# Patient Record
Sex: Male | Born: 2000 | Hispanic: No | Marital: Single | Smoking: Never smoker
Health system: Southern US, Community
[De-identification: ages and names within clinical notes are randomized; demographics above are authoritative.]

---
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
## 2022-03-16 IMAGING — DX DG FOOT COMPLETE 3+V*L*
3 series · 3 of 3 positions shown · non-contrast
Comparison: None available.

CLINICAL DATA: Reevaluate fifth metatarsal base fracture

EXAM:
LEFT FOOT - COMPLETE 3+ VIEW

[foot ap]
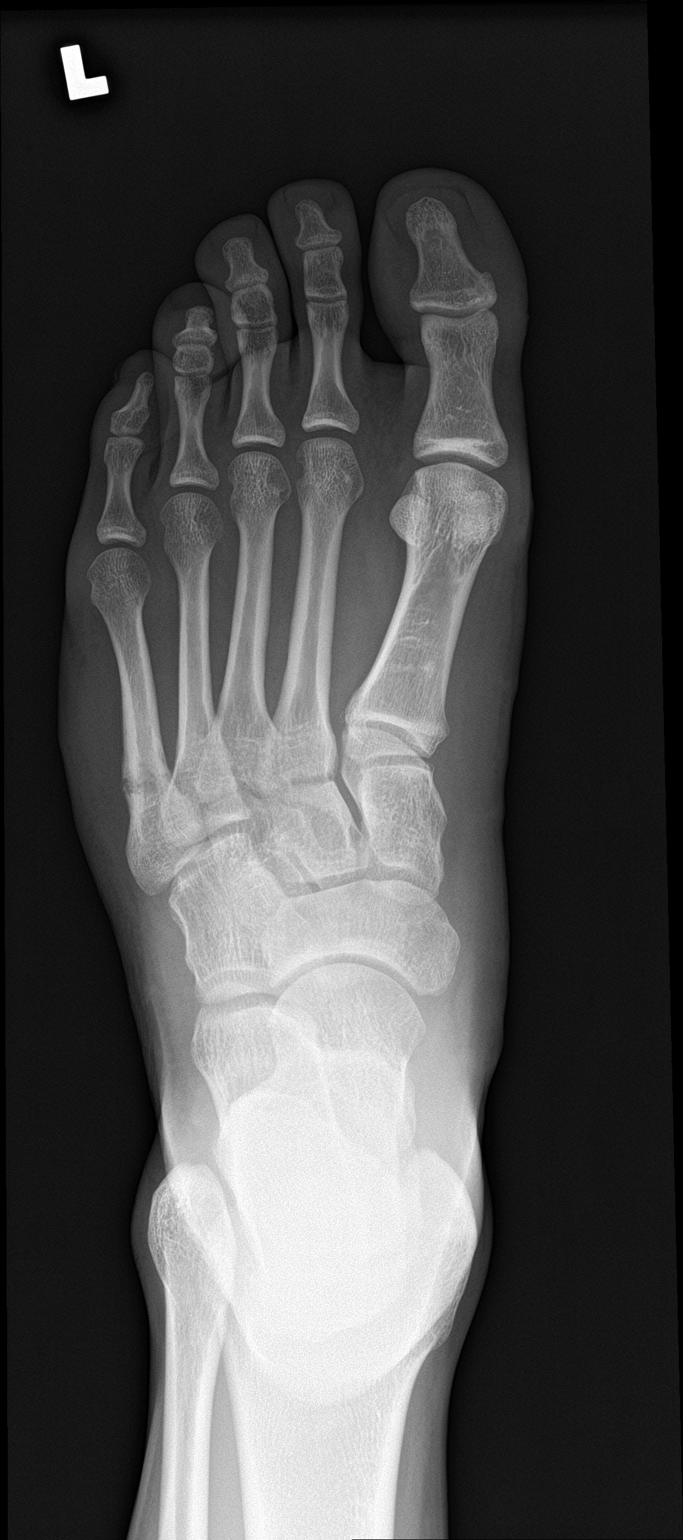

[foot obl]
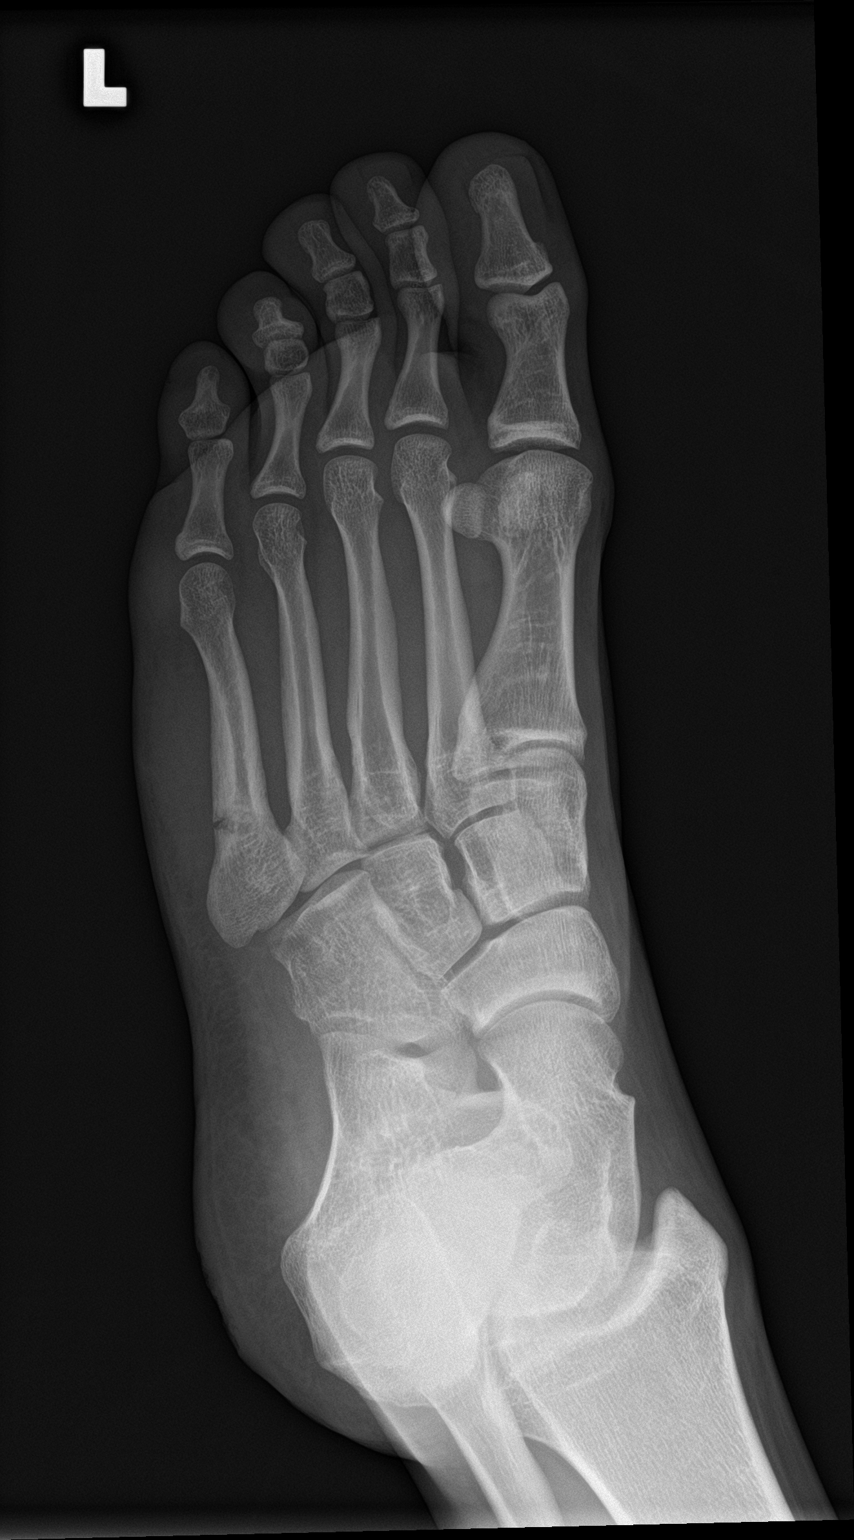

[foot lat]
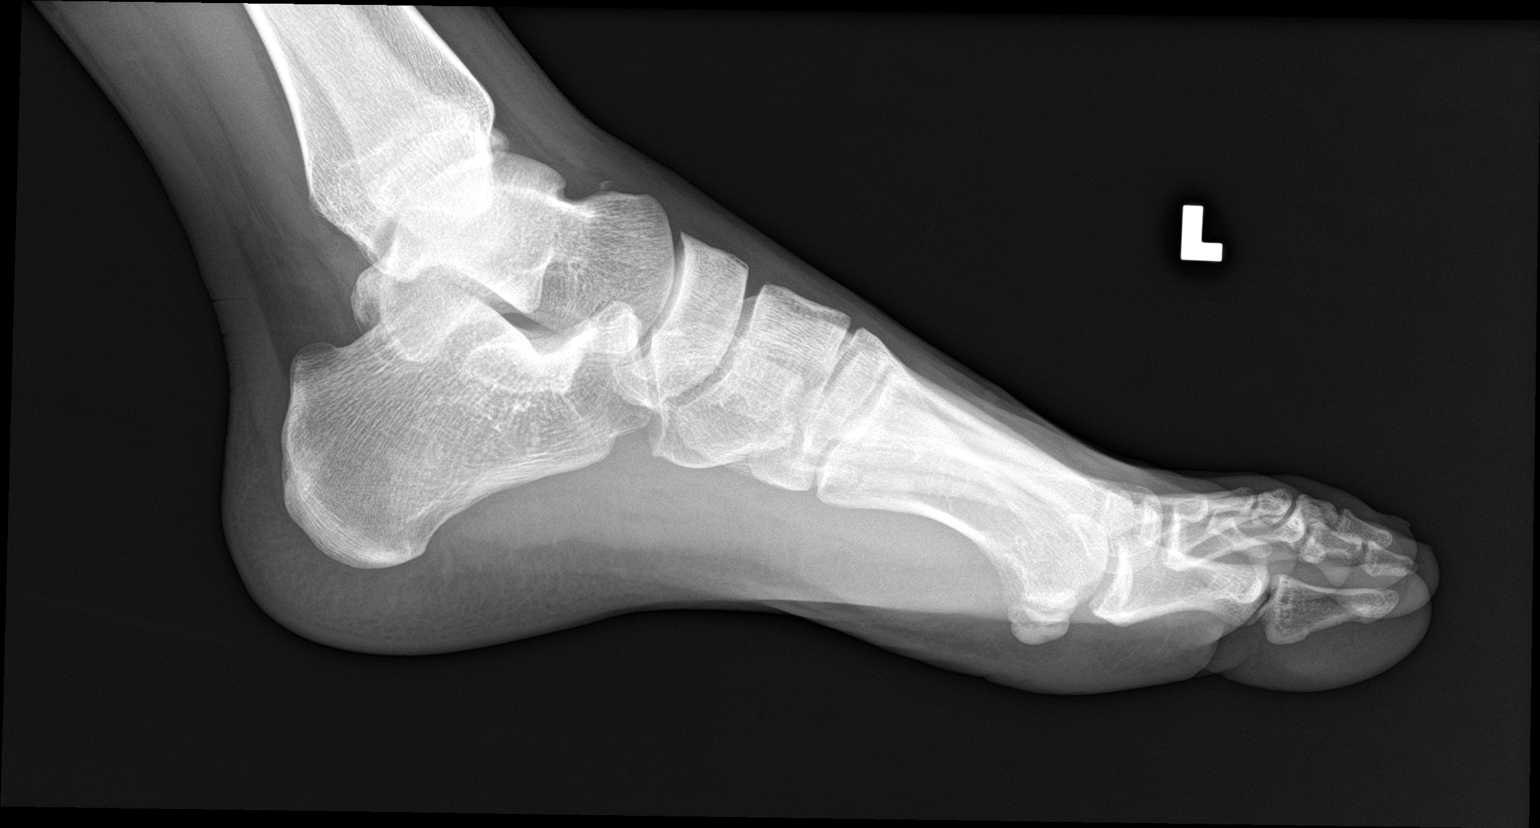

[3 of 3 positions shown; findings below may reference images not displayed]

FINDINGS: Nondisplaced fracture at the base of the fifth metatarsal has
incomplete bony bridging. There is some periosteal new bone
peripherally. Tiny osseous density adjacent to the dorsal talus is
suspicious for tiny avulsion fracture, of unknown acuity. Normal
alignment and joint spaces. No focal soft tissue abnormality.
IMPRESSION: 1. Nondisplaced fracture at the base of the fifth metatarsal has
partial bony bridging. Mild surrounding periosteal new bone
consistent with healing.
2. Osseous density adjacent to the dorsal talus is suspicious for
tiny avulsion fracture, of unknown acuity.

## 2022-04-06 IMAGING — DX DG KNEE COMPLETE 4+V*R*
4 series · 4 of 4 positions shown · non-contrast
Comparison: None.

CLINICAL DATA: Right knee instability

EXAM:
RIGHT KNEE - COMPLETE 4+ VIEW

[knee ap]
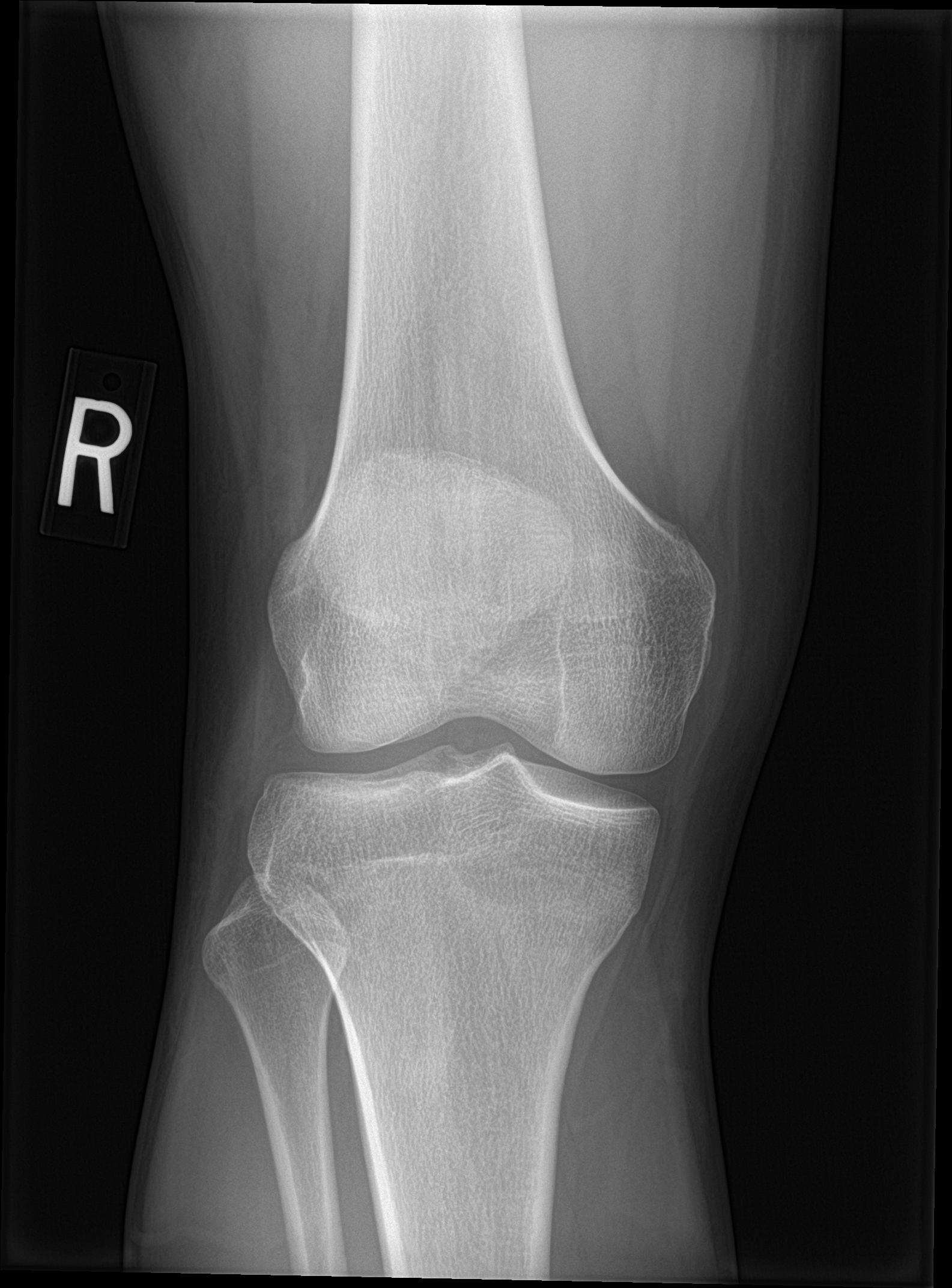

[knee lat]
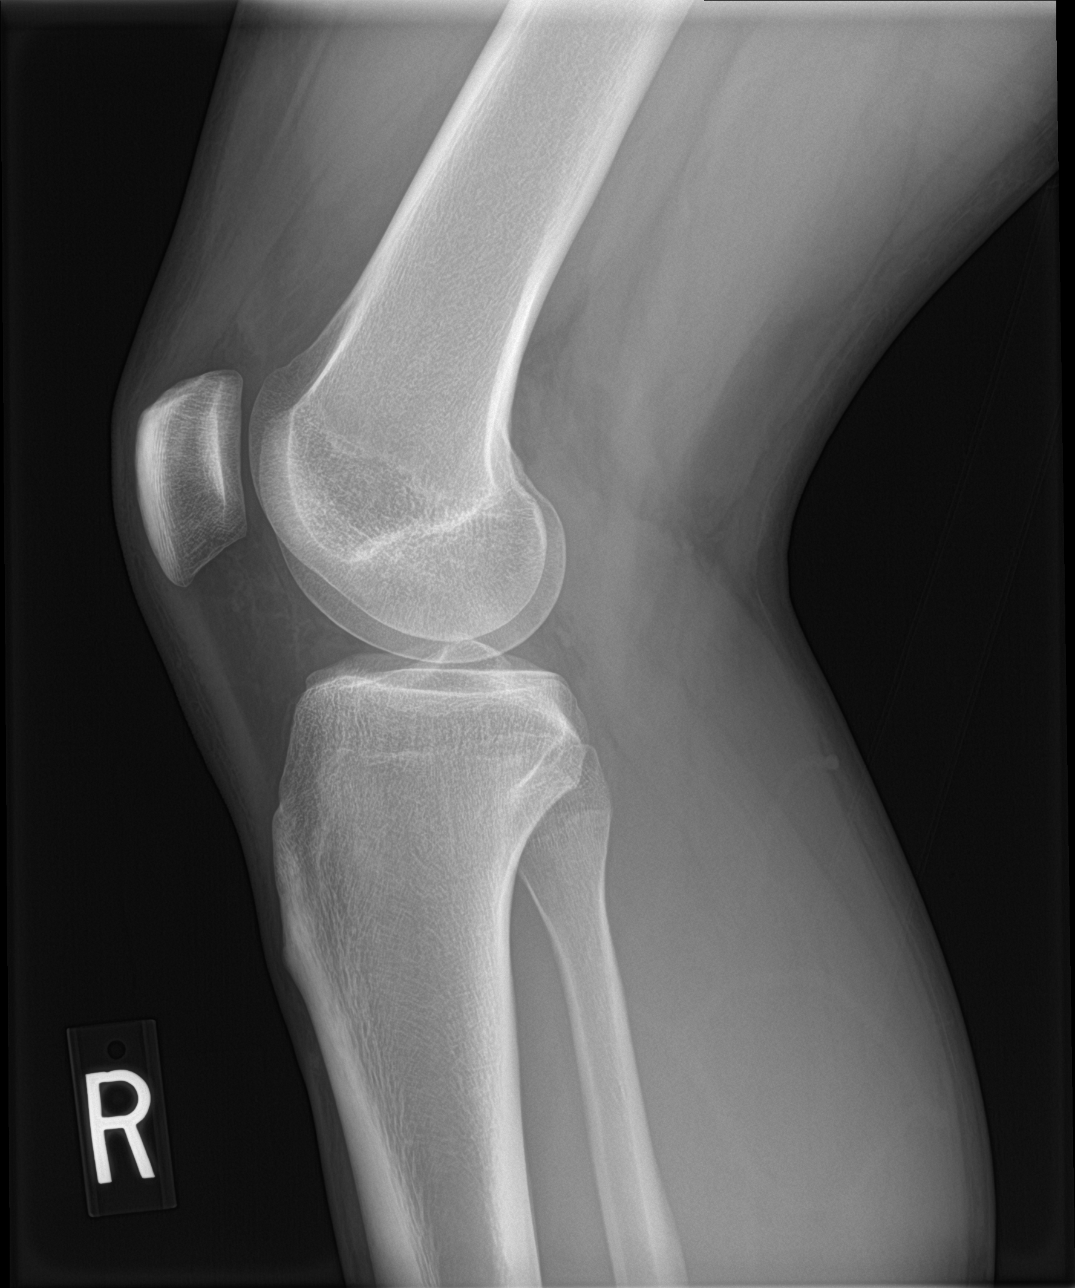

[knee obl (1 of 2)]
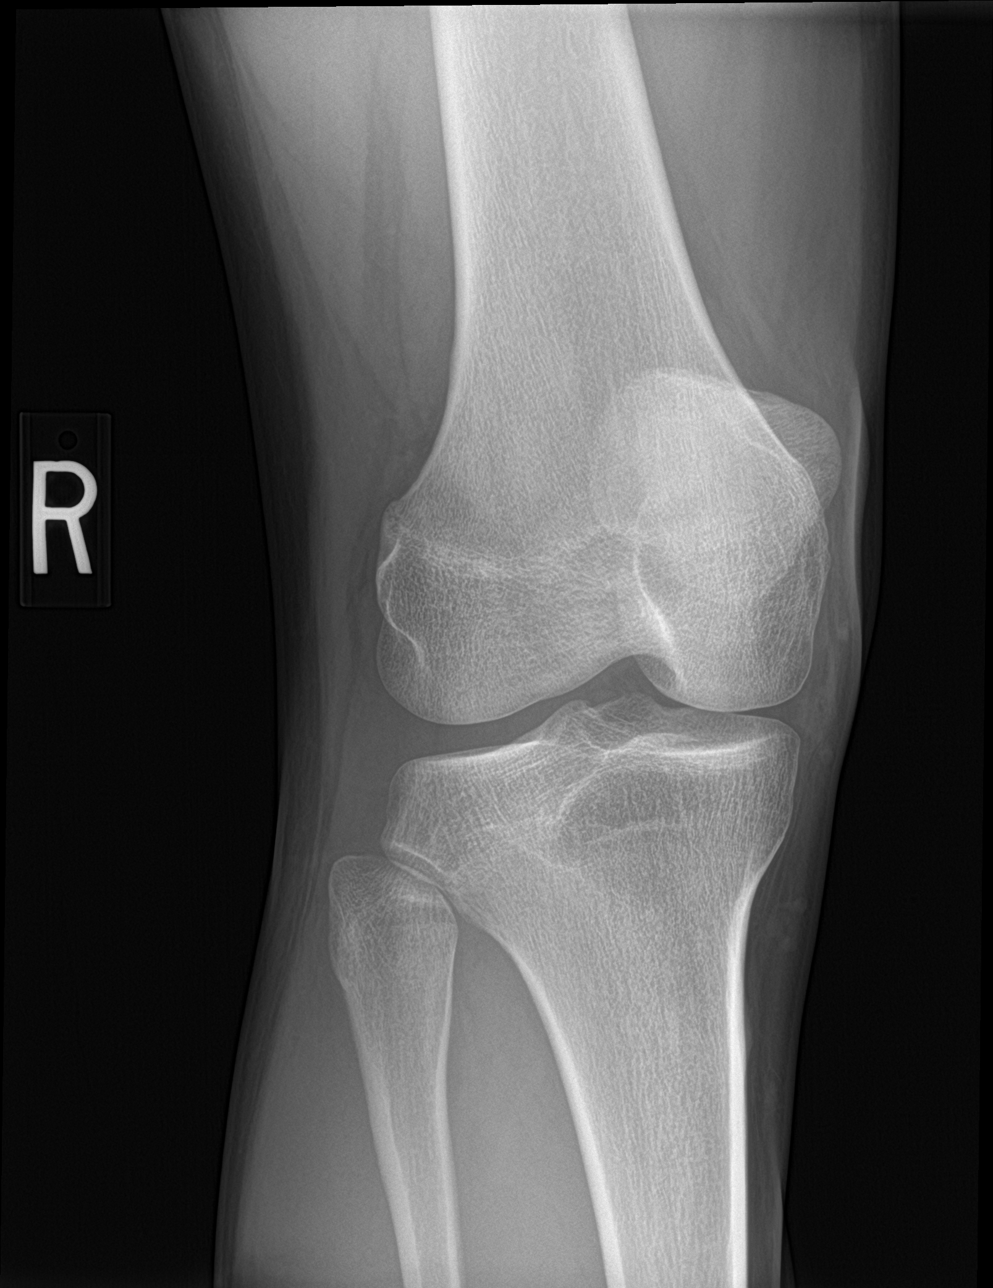

[knee obl (2 of 2)]
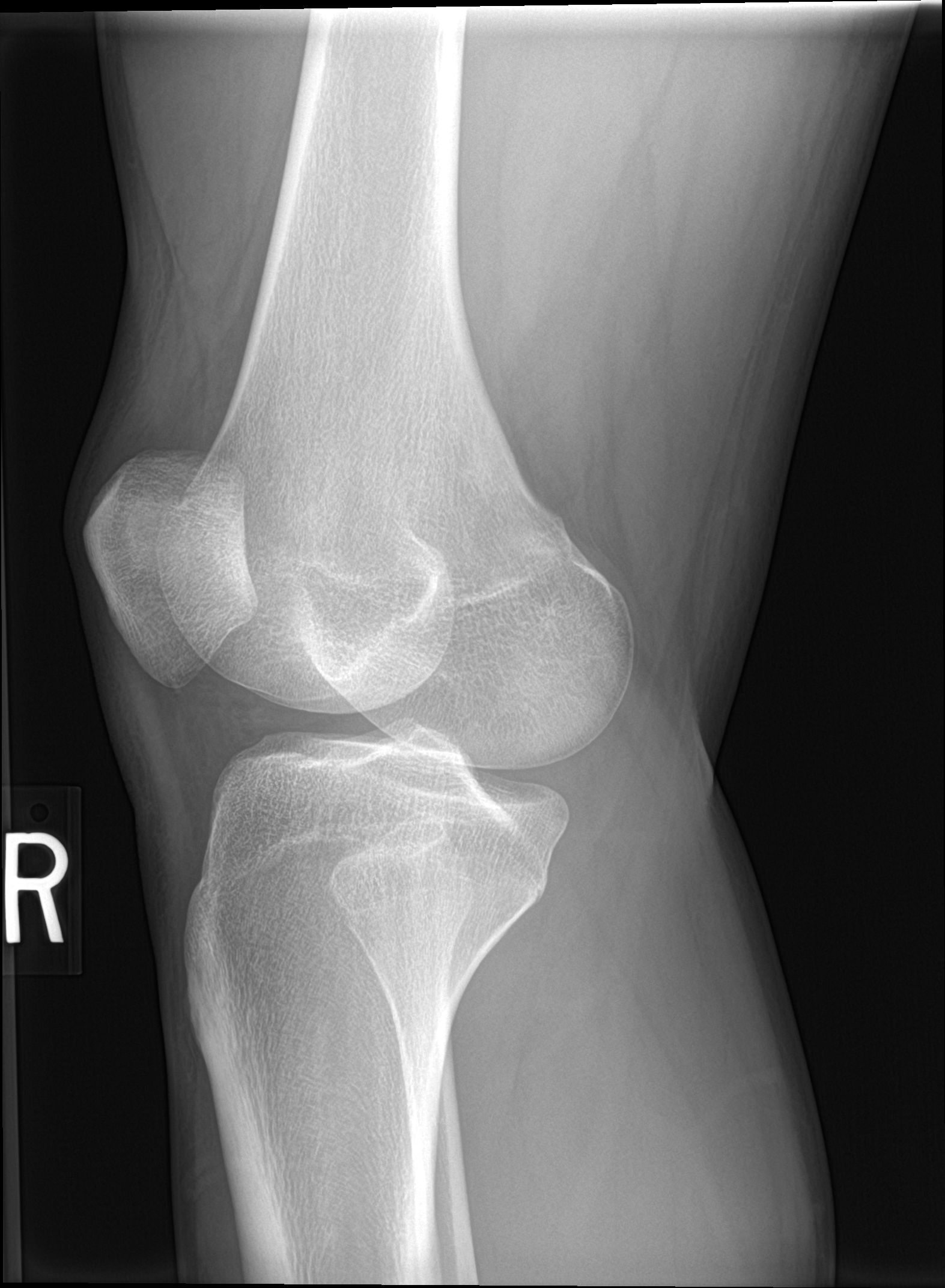

[4 of 4 positions shown; findings below may reference images not displayed]

FINDINGS: No evidence of fracture, dislocation, or joint effusion. No evidence
of arthropathy or other focal bone abnormality. Soft tissues are
unremarkable.
IMPRESSION: Negative.

## 2024-02-01 ENCOUNTER — Ambulatory Visit
